# Patient Record
Sex: Male | Born: 1995 | Hispanic: No | Marital: Single | Smoking: Never smoker
Health system: Southern US, Community
[De-identification: ages and names within clinical notes are randomized; demographics above are authoritative.]

## PROBLEM LIST (undated history)

## (undated) HISTORY — PX: CLOSED REDUCTION NASAL FRACTURE: SHX5365

---
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
## 2015-01-25 ENCOUNTER — Emergency Department (INDEPENDENT_AMBULATORY_CARE_PROVIDER_SITE_OTHER)
Admission: EM | Admit: 2015-01-25 | Discharge: 2015-01-25 | Disposition: A | Discharge status: Home / Self Care | Payer: Medicaid Other | Source: Home / Self Care

## 2015-01-25 ENCOUNTER — Encounter (HOSPITAL_COMMUNITY): Payer: Self-pay

## 2015-01-25 ENCOUNTER — Emergency Department (INDEPENDENT_AMBULATORY_CARE_PROVIDER_SITE_OTHER): Payer: Medicaid Other

## 2015-01-25 ENCOUNTER — Emergency Department (HOSPITAL_COMMUNITY): Payer: Medicaid Other

## 2015-01-25 DIAGNOSIS — S93401A Sprain of unspecified ligament of right ankle, initial encounter: Secondary | ICD-10-CM | POA: Diagnosis not present

## 2015-01-25 NOTE — ED Provider Notes (Addendum)
CSN: [id number]     Arrival date & time 01/25/15  1307 History   First MD Initiated Contact with Patient 01/25/15 1329     Chief Complaint  Patient presents with  . Ankle Pain    HPI  19 yo man who twisted right ankle two weeks ago playing soccer.  He still has some pain and ecchymosis just inferior and posterior to the lateral malleolus.  Pain is worsened by dorsi and plantar flexion of the foot. No pain in knee or hip. Patient able to bear weight.  No past medical history on file. Past Surgical History  Procedure Laterality Date  . Closed reduction nasal fracture     No family history on file. History  Substance Use Topics  . Smoking status: Not on file  . Smokeless tobacco: Not on file  . Alcohol Use: Not on file    Review of Systems  Allergies  Review of patient's allergies indicates no known allergies.  Home Medications   Prior to Admission medications   Not on File   BP 115/74 mmHg  Pulse 69  Temp(Src) 98.3 F (36.8 C) (Oral)  Resp 16  SpO2 100% Physical Exam  Constitutional: He is oriented to person, place, and time. He appears well-developed and well-nourished.  HENT:  Head: Normocephalic and atraumatic.  Eyes: Conjunctivae and EOM are normal. Pupils are equal, round, and reactive to light.  Neck: Normal range of motion. Neck supple.  Neurological: He is alert and oriented to person, place, and time.  Nursing note and vitals reviewed.    NAD Right ankle shows moderate swelling anterior and inferior to lateral malleolus with tenderness inferiorly and posterioly to the malleolus.  FROM of ankle is present Minimal ecchymosis.  ED Course  Procedures (including critical care time) Labs Review Labs Reviewed - No data to display  Imaging Review Negative preliminary read in clinic  MDM    ASO ankle support Follow up in 1 week if not improving  Elvina SidleKurt Viola Placeres, MD  Elvina SidleKurt Yehudit Fulginiti, MD 01/25/15 1410  Elvina SidleKurt Junnie Loschiavo, MD 01/25/15 [id number]

## 2015-01-25 NOTE — Discharge Instructions (Signed)

## 2015-01-25 NOTE — ED Notes (Addendum)
Twisted right ankle 2 weeks ago. C/o continues to have pain, swelling , slight color change. Home treament w elevation only
